# Patient Record
Sex: Female | Born: 1960 | Race: White | Hispanic: No | Marital: Married | Smoking: Never smoker
Health system: Southern US, Community
[De-identification: ages and names within clinical notes are randomized; demographics above are authoritative.]

## PROBLEM LIST (undated history)

## (undated) HISTORY — PX: MOUTH SURGERY: SHX715

---

## 1998-01-01 ENCOUNTER — Other Ambulatory Visit: Admission: RE | Admit: 1998-01-01 | Discharge: 1998-01-01 | Payer: Self-pay | Admitting: Obstetrics and Gynecology

## 2012-12-08 ENCOUNTER — Other Ambulatory Visit (HOSPITAL_COMMUNITY)
Admission: RE | Admit: 2012-12-08 | Discharge: 2012-12-08 | Disposition: A | Discharge status: Home / Self Care | Payer: BC Managed Care – PPO | Source: Ambulatory Visit | Attending: Obstetrics and Gynecology | Admitting: Obstetrics and Gynecology

## 2012-12-08 ENCOUNTER — Other Ambulatory Visit: Payer: Self-pay | Admitting: Obstetrics and Gynecology

## 2012-12-08 DIAGNOSIS — Z1151 Encounter for screening for human papillomavirus (HPV): Secondary | ICD-10-CM | POA: Insufficient documentation

## 2012-12-08 DIAGNOSIS — Z01419 Encounter for gynecological examination (general) (routine) without abnormal findings: Secondary | ICD-10-CM | POA: Insufficient documentation

## 2019-09-24 ENCOUNTER — Ambulatory Visit: Payer: Self-pay | Attending: Internal Medicine

## 2019-09-24 DIAGNOSIS — Z23 Encounter for immunization: Secondary | ICD-10-CM | POA: Insufficient documentation

## 2019-09-24 NOTE — Progress Notes (Signed)
   Covid-19 Vaccination Clinic  Name:  Carolyn Dominguez    MRN: [id number] DOB: 09/24/1960  09/24/2019  Carolyn Dominguez was observed post Covid-19 immunization for 15 minutes without incidence. She was provided with Vaccine Information Sheet and instruction to access the V-Safe system.   Carolyn Dominguez was instructed to call 911 with any severe reactions post vaccine: Marland Kitchen Difficulty breathing  . Swelling of your face and throat  . A fast heartbeat  . A bad rash all over your body  . Dizziness and weakness    Immunizations Administered    Name Date Dose VIS Date Route   Pfizer COVID-19 Vaccine 09/24/2019 12:47 PM 0.3 mL 07/08/2019 Intramuscular   Manufacturer: [employer], [employer]   Lot: [id number]   NDC: [address]-3

## 2019-10-15 ENCOUNTER — Ambulatory Visit: Payer: Self-pay | Attending: Internal Medicine

## 2019-10-15 DIAGNOSIS — Z23 Encounter for immunization: Secondary | ICD-10-CM

## 2019-10-15 NOTE — Progress Notes (Signed)
   Covid-19 Vaccination Clinic  Name:  Carolyn Dominguez    MRN: [id number] DOB: 1961-05-03  10/15/2019  Carolyn Dominguez was observed post Covid-19 immunization for 15 minutes without incident. She was provided with Vaccine Information Sheet and instruction to access the V-Safe system.   Carolyn Dominguez was instructed to call 911 with any severe reactions post vaccine: Marland Kitchen Difficulty breathing  . Swelling of face and throat  . A fast heartbeat  . A bad rash all over body  . Dizziness and weakness   Immunizations Administered    Name Date Dose VIS Date Route   Pfizer COVID-19 Vaccine 10/15/2019 12:41 PM 0.3 mL 07/08/2019 Intramuscular   Manufacturer: [employer], [employer]   Lot: [id number]   NDC: [address]-6

## 2019-10-19 ENCOUNTER — Ambulatory Visit: Payer: Self-pay

## 2020-03-16 ENCOUNTER — Other Ambulatory Visit: Payer: Self-pay

## 2020-03-16 ENCOUNTER — Ambulatory Visit: Payer: Self-pay

## 2020-03-16 ENCOUNTER — Other Ambulatory Visit: Payer: Self-pay | Admitting: Occupational Medicine

## 2020-03-16 DIAGNOSIS — M25532 Pain in left wrist: Secondary | ICD-10-CM

## 2020-03-20 ENCOUNTER — Other Ambulatory Visit: Payer: Self-pay

## 2020-03-20 ENCOUNTER — Other Ambulatory Visit: Payer: Self-pay | Admitting: Occupational Medicine

## 2020-03-20 ENCOUNTER — Ambulatory Visit: Payer: Self-pay

## 2020-03-20 DIAGNOSIS — M25522 Pain in left elbow: Secondary | ICD-10-CM

## 2021-04-08 ENCOUNTER — Other Ambulatory Visit (HOSPITAL_COMMUNITY): Payer: Self-pay | Admitting: Orthopedic Surgery

## 2021-04-25 ENCOUNTER — Other Ambulatory Visit: Payer: Self-pay

## 2021-04-25 ENCOUNTER — Encounter (HOSPITAL_BASED_OUTPATIENT_CLINIC_OR_DEPARTMENT_OTHER): Payer: Self-pay | Admitting: Orthopedic Surgery

## 2021-04-26 ENCOUNTER — Other Ambulatory Visit (HOSPITAL_BASED_OUTPATIENT_CLINIC_OR_DEPARTMENT_OTHER): Payer: Self-pay

## 2021-05-02 ENCOUNTER — Encounter (HOSPITAL_BASED_OUTPATIENT_CLINIC_OR_DEPARTMENT_OTHER)
Admission: RE | Disposition: A | Discharge status: Home / Self Care | Payer: Self-pay | Source: Home / Self Care | Attending: Orthopedic Surgery

## 2021-05-02 ENCOUNTER — Ambulatory Visit (HOSPITAL_BASED_OUTPATIENT_CLINIC_OR_DEPARTMENT_OTHER): Payer: BC Managed Care – PPO | Admitting: Anesthesiology

## 2021-05-02 ENCOUNTER — Ambulatory Visit (HOSPITAL_BASED_OUTPATIENT_CLINIC_OR_DEPARTMENT_OTHER)
Admission: RE | Admit: 2021-05-02 | Discharge: 2021-05-02 | Disposition: A | Discharge status: Home / Self Care | Payer: BC Managed Care – PPO | Attending: Orthopedic Surgery | Admitting: Orthopedic Surgery

## 2021-05-02 ENCOUNTER — Other Ambulatory Visit: Payer: Self-pay

## 2021-05-02 DIAGNOSIS — Z882 Allergy status to sulfonamides status: Secondary | ICD-10-CM | POA: Insufficient documentation

## 2021-05-02 DIAGNOSIS — M2022 Hallux rigidus, left foot: Secondary | ICD-10-CM | POA: Insufficient documentation

## 2021-05-02 SURGERY — CHEILECTOMY, GREAT TOE, WITH IMPLANT INSERTION
Anesthesia: General | Laterality: Left

## 2021-05-02 MED ORDER — FENTANYL CITRATE (PF) 100 MCG/2ML IJ SOLN: 25.0000 ug | INTRAMUSCULAR | Status: DC | PRN

## 2021-05-02 MED ORDER — CEFAZOLIN SODIUM-DEXTROSE 2-4 GM/100ML-% IV SOLN
2.0000 g | INTRAVENOUS | Status: AC
Start: 2021-05-02 — End: 2021-05-02
  Administered 2021-05-02: 2 g via INTRAVENOUS

## 2021-05-02 MED ORDER — FENTANYL CITRATE (PF) 100 MCG/2ML IJ SOLN
100.0000 ug | Freq: Once | INTRAMUSCULAR | Status: AC
  Administered 2021-05-02: 50 ug via INTRAVENOUS

## 2021-05-02 MED ORDER — DEXAMETHASONE SODIUM PHOSPHATE 10 MG/ML IJ SOLN
INTRAMUSCULAR | Status: DC | PRN
  Administered 2021-05-02: 4 mg via INTRAVENOUS

## 2021-05-02 MED ORDER — ACETAMINOPHEN 500 MG PO TABS
ORAL_TABLET | ORAL | Status: AC
  Filled 2021-05-02: qty 2

## 2021-05-02 MED ORDER — LACTATED RINGERS IV SOLN: INTRAVENOUS | Status: DC

## 2021-05-02 MED ORDER — PROPOFOL 500 MG/50ML IV EMUL
INTRAVENOUS | Status: DC | PRN
  Administered 2021-05-02: 25 ug/kg/min via INTRAVENOUS

## 2021-05-02 MED ORDER — CEFAZOLIN SODIUM-DEXTROSE 2-4 GM/100ML-% IV SOLN
INTRAVENOUS | Status: AC
  Filled 2021-05-02: qty 100

## 2021-05-02 MED ORDER — BUPIVACAINE-EPINEPHRINE (PF) 0.5% -1:200000 IJ SOLN
INTRAMUSCULAR | Status: DC | PRN
  Administered 2021-05-02: 30 mL via PERINEURAL
  Administered 2021-05-02: 10 mL via PERINEURAL

## 2021-05-02 MED ORDER — LIDOCAINE 2% (20 MG/ML) 5 ML SYRINGE
INTRAMUSCULAR | Status: DC | PRN
  Administered 2021-05-02: 60 mg via INTRAVENOUS

## 2021-05-02 MED ORDER — MIDAZOLAM HCL 2 MG/2ML IJ SOLN
INTRAMUSCULAR | Status: AC
  Filled 2021-05-02: qty 2

## 2021-05-02 MED ORDER — ONDANSETRON HCL 4 MG/2ML IJ SOLN
INTRAMUSCULAR | Status: DC | PRN
  Administered 2021-05-02: 4 mg via INTRAVENOUS

## 2021-05-02 MED ORDER — SODIUM CHLORIDE 0.9 % IV SOLN
INTRAVENOUS | Status: DC
Start: 2021-05-02 — End: 2021-05-02

## 2021-05-02 MED ORDER — 0.9 % SODIUM CHLORIDE (POUR BTL) OPTIME
TOPICAL | Status: DC | PRN
  Administered 2021-05-02: 500 mL

## 2021-05-02 MED ORDER — PROPOFOL 10 MG/ML IV BOLUS
INTRAVENOUS | Status: DC | PRN
  Administered 2021-05-02: 150 mg via INTRAVENOUS

## 2021-05-02 MED ORDER — ONDANSETRON HCL 4 MG/2ML IJ SOLN: 4.0000 mg | Freq: Once | INTRAMUSCULAR | Status: DC | PRN

## 2021-05-02 MED ORDER — OXYCODONE HCL 5 MG PO TABS: 5.0000 mg | ORAL_TABLET | Freq: Four times a day (QID) | ORAL | 0 refills | Status: AC | PRN

## 2021-05-02 MED ORDER — MIDAZOLAM HCL 2 MG/2ML IJ SOLN
2.0000 mg | Freq: Once | INTRAMUSCULAR | Status: AC
  Administered 2021-05-02: 2 mg via INTRAVENOUS

## 2021-05-02 MED ORDER — VANCOMYCIN HCL 500 MG IV SOLR
INTRAVENOUS | Status: DC | PRN
  Administered 2021-05-02: 500 mg

## 2021-05-02 MED ORDER — CLONIDINE HCL (ANALGESIA) 100 MCG/ML EP SOLN
EPIDURAL | Status: DC | PRN
  Administered 2021-05-02: 50 ug

## 2021-05-02 MED ORDER — ACETAMINOPHEN 500 MG PO TABS
1000.0000 mg | ORAL_TABLET | Freq: Once | ORAL | Status: AC
  Administered 2021-05-02: 1000 mg via ORAL

## 2021-05-02 MED ORDER — FENTANYL CITRATE (PF) 100 MCG/2ML IJ SOLN
INTRAMUSCULAR | Status: AC
  Filled 2021-05-02: qty 2

## 2021-05-02 SURGICAL SUPPLY — 59 items
APL PRP STRL LF DISP 70% ISPRP (MISCELLANEOUS) ×1
BANDAGE ESMARK 6X9 LF (GAUZE/BANDAGES/DRESSINGS) IMPLANT
BLADE SURG 15 STRL LF DISP TIS (BLADE) ×2 IMPLANT
BLADE SURG 15 STRL SS (BLADE) ×6
BNDG CMPR 9X4 STRL LF SNTH (GAUZE/BANDAGES/DRESSINGS)
BNDG CMPR 9X6 STRL LF SNTH (GAUZE/BANDAGES/DRESSINGS)
BNDG COHESIVE 4X5 TAN ST LF (GAUZE/BANDAGES/DRESSINGS) ×3 IMPLANT
BNDG CONFORM 2 STRL LF (GAUZE/BANDAGES/DRESSINGS) IMPLANT
BNDG CONFORM 3 STRL LF (GAUZE/BANDAGES/DRESSINGS) ×3 IMPLANT
BNDG ELASTIC 4X5.8 VLCR STR LF (GAUZE/BANDAGES/DRESSINGS) ×3 IMPLANT
BNDG ESMARK 4X9 LF (GAUZE/BANDAGES/DRESSINGS) IMPLANT
BNDG ESMARK 6X9 LF (GAUZE/BANDAGES/DRESSINGS)
CHLORAPREP W/TINT 26 (MISCELLANEOUS) ×3 IMPLANT
COVER BACK TABLE 60X90IN (DRAPES) ×3 IMPLANT
CUFF TOURN SGL QUICK 34 (TOURNIQUET CUFF)
CUFF TRNQT CYL 34X4.125X (TOURNIQUET CUFF) IMPLANT
DRAPE EXTREMITY T 121X128X90 (DISPOSABLE) ×3 IMPLANT
DRAPE OEC MINIVIEW 54X84 (DRAPES) IMPLANT
DRAPE SURG 17X23 STRL (DRAPES) IMPLANT
DRAPE U-SHAPE 47X51 STRL (DRAPES) ×3 IMPLANT
DRSG MEPITEL 4X7.2 (GAUZE/BANDAGES/DRESSINGS) ×3 IMPLANT
DRSG PAD ABDOMINAL 8X10 ST (GAUZE/BANDAGES/DRESSINGS) ×3 IMPLANT
ELECT REM PT RETURN 9FT ADLT (ELECTROSURGICAL) ×3
ELECTRODE REM PT RTRN 9FT ADLT (ELECTROSURGICAL) ×1 IMPLANT
GAUZE SPONGE 4X4 12PLY STRL (GAUZE/BANDAGES/DRESSINGS) ×3 IMPLANT
GLOVE SRG 8 PF TXTR STRL LF DI (GLOVE) ×2 IMPLANT
GLOVE SURG ENC MOIS LTX SZ8 (GLOVE) ×3 IMPLANT
GLOVE SURG LTX SZ8 (GLOVE) ×3 IMPLANT
GLOVE SURG UNDER POLY LF SZ8 (GLOVE) ×6
GOWN STRL REUS W/ TWL LRG LVL3 (GOWN DISPOSABLE) ×1 IMPLANT
GOWN STRL REUS W/ TWL XL LVL3 (GOWN DISPOSABLE) ×2 IMPLANT
GOWN STRL REUS W/TWL LRG LVL3 (GOWN DISPOSABLE) ×3
GOWN STRL REUS W/TWL XL LVL3 (GOWN DISPOSABLE) ×6
IMPL MTP CARTIVA 8MM (Orthopedic Implant) ×1 IMPLANT
IMPLANT MTP CARTIVA 8MM (Orthopedic Implant) ×3 IMPLANT
NEEDLE HYPO 25X1 1.5 SAFETY (NEEDLE) IMPLANT
NS IRRIG 1000ML POUR BTL (IV SOLUTION) ×3 IMPLANT
PACK BASIN DAY SURGERY FS (CUSTOM PROCEDURE TRAY) ×3 IMPLANT
PAD CAST 4YDX4 CTTN HI CHSV (CAST SUPPLIES) ×1 IMPLANT
PADDING CAST COTTON 4X4 STRL (CAST SUPPLIES) ×3
PENCIL SMOKE EVACUATOR (MISCELLANEOUS) ×3 IMPLANT
SANITIZER HAND PURELL 535ML FO (MISCELLANEOUS) ×3 IMPLANT
SHEET MEDIUM DRAPE 40X70 STRL (DRAPES) ×3 IMPLANT
SLEEVE SCD COMPRESS KNEE MED (STOCKING) ×3 IMPLANT
SPONGE T-LAP 18X18 ~~LOC~~+RFID (SPONGE) ×3 IMPLANT
STOCKINETTE 6  STRL (DRAPES) ×3
STOCKINETTE 6 STRL (DRAPES) ×1 IMPLANT
SUCTION FRAZIER HANDLE 10FR (MISCELLANEOUS) ×3
SUCTION TUBE FRAZIER 10FR DISP (MISCELLANEOUS) ×1 IMPLANT
SUT ETHILON 3 0 PS 1 (SUTURE) ×3 IMPLANT
SUT MNCRL AB 3-0 PS2 18 (SUTURE) ×3 IMPLANT
SUT VIC AB 2-0 SH 27 (SUTURE) ×3
SUT VIC AB 2-0 SH 27XBRD (SUTURE) ×1 IMPLANT
SYR BULB EAR ULCER 3OZ GRN STR (SYRINGE) ×3 IMPLANT
SYR CONTROL 10ML LL (SYRINGE) IMPLANT
TOWEL GREEN STERILE FF (TOWEL DISPOSABLE) ×3 IMPLANT
TUBE CONNECTING 20'X1/4 (TUBING) ×1
TUBE CONNECTING 20X1/4 (TUBING) ×2 IMPLANT
UNDERPAD 30X36 HEAVY ABSORB (UNDERPADS AND DIAPERS) ×3 IMPLANT

## 2021-05-02 NOTE — Anesthesia Procedure Notes (Signed)
Anesthesia Regional Block: Adductor canal block   Pre-Anesthetic Checklist: , timeout performed,  Correct Patient, Correct Site, Correct Laterality,  Correct Procedure, Correct Position, site marked,  Risks and benefits discussed,  Surgical consent,  Pre-op evaluation,  At surgeon's request and post-op pain management  Laterality: Left  Prep: chloraprep       Needles:  Injection technique: Single-shot  Needle Type: Echogenic Needle     Needle Length: 9cm  Needle Gauge: 21     Additional Needles:   Procedures:,,,, ultrasound used (permanent image in chart),,    Narrative:  Start time: 05/02/2021 8:08 AM End time: 05/02/2021 8:12 AM Injection made incrementally with aspirations every 5 mL.  Performed by: Personally  Anesthesiologist: Cecile Hearing, MD  Additional Notes: No pain on injection. No increased resistance to injection. Injection made in 5cc increments.  Good needle visualization.  Patient tolerated procedure well.

## 2021-05-02 NOTE — Anesthesia Procedure Notes (Signed)
Procedure Name: LMA Insertion Date/Time: 05/02/2021 9:06 AM Performed by: Sheryn Bison, CRNA Pre-anesthesia Checklist: Patient identified, Emergency Drugs available, Suction available and Patient being monitored Patient Re-evaluated:Patient Re-evaluated prior to induction Oxygen Delivery Method: Circle System Utilized Preoxygenation: Pre-oxygenation with 100% oxygen Induction Type: IV induction Ventilation: Mask ventilation without difficulty LMA: LMA inserted LMA Size: 4.0 Number of attempts: 1 Airway Equipment and Method: bite block Placement Confirmation: positive ETCO2 Tube secured with: Tape Dental Injury: Teeth and Oropharynx as per pre-operative assessment

## 2021-05-02 NOTE — Transfer of Care (Signed)
Immediate Anesthesia Transfer of Care Note  Patient: Carolyn Dominguez  Procedure(s) Performed: Left hallux metatarsophalangeal joint cheilectomy and cheilectomy with joint resurfacing (Left)  Patient Location: PACU  Anesthesia Type:GA combined with regional for post-op pain  Level of Consciousness: sedated  Airway & Oxygen Therapy: Patient Spontanous Breathing and Patient connected to face mask oxygen  Post-op Assessment: Report given to RN and Post -op Vital signs reviewed and stable  Post vital signs: Reviewed and stable  Last Vitals:  Vitals Value Taken Time  BP 90/50 05/02/21 0943  Temp    Pulse 65 05/02/21 0944  Resp 13 05/02/21 0944  SpO2 100 % 05/02/21 0944  Vitals shown include unvalidated device data.  Last Pain:  Vitals:   05/02/21 0718  TempSrc: Oral  PainSc: 0-No pain      Patients Stated Pain Goal: 3 (05/02/21 4081)  Complications: No notable events documented.

## 2021-05-02 NOTE — H&P (Signed)
Carolyn Dominguez is an 60 y.o. female.   Chief Complaint: Left foot pain HPI: 60 year old female without significant past medical history has a long history of left forefoot pain due to hallux rigidus.  She has failed nonoperative treatment to date and presents for surgical correction of this painful left forefoot condition.  History reviewed. No pertinent past medical history.  Past Surgical History:  Procedure Laterality Date   MOUTH SURGERY      History reviewed. No pertinent family history. Social History:  reports that she has never smoked. She has never used smokeless tobacco. She reports that she does not drink alcohol. No history on file for drug use.  Allergies:  Allergies  Allergen Reactions   Sulfa Antibiotics Itching    No medications prior to admission.    No results found for this or any previous visit (from the past 48 hour(s)). No results found.  Review of Systems no recent fever, chills, nausea, vomiting or changes in her appetite  Blood pressure 119/78, pulse 66, temperature 98 F (36.7 C), temperature source Oral, resp. rate 17, height 5\' 6"  (1.676 m), weight 75.8 kg, SpO2 98 %. Physical Exam  Well-nourished well-developed woman in no apparent distress.  Alert and oriented x4.  Normal mood and affect.  Gait is normal.  The left hallux has decreased range of motion.  Tender bony prominences dorsally over the first metatarsal head.  Skin is healthy and intact.  Pulses are palpable.  No lymphadenopathy.  Assessment/Plan Left hallux rigidus -to the operating room today for hallux MP joint cheilectomy and possible resurfacing.  The risks and benefits of the alternative treatment options have been discussed in detail.  The patient wishes to proceed with surgery and specifically understands risks of bleeding, infection, nerve damage, blood clots, need for additional surgery, amputation and death.   , MD 05-11-21, 8:48 AM

## 2021-05-02 NOTE — Discharge Instructions (Addendum)
Toni Arthurs, MD [facility]  Please read the following information regarding your care after surgery.  Medications  You only need a prescription for the narcotic pain medicine (ex. oxycodone, Percocet, Norco).  All of the other medicines listed below are available over the counter. X Aleve 2 pills twice a day for the first 3 days after surgery. X acetominophen (Tylenol) 650 mg every 4-6 hours as you need for minor to moderate pain X oxycodone as prescribed for severe pain  Narcotic pain medicine (ex. oxycodone, Percocet, Vicodin) will cause constipation.  To prevent this problem, take the following medicines while you are taking any pain medicine. X docusate sodium (Colace) 100 mg twice a day X senna (Senokot) 2 tablets twice a day  ? To help prevent blood clots, take a baby aspirin (81 mg) twice a day for two weeks after surgery.  You should also get up every hour while you are awake to move around.    Weight Bearing ? Bear weight when you are able on your operated leg or foot. X Bear weight only on your operated foot in the post-op shoe. ? Do not bear any weight on the operated leg or foot.  Cast / Splint / Dressing X Keep your splint, cast or dressing clean and dry.  Don't put anything (coat hanger, pencil, etc) down inside of it.  If it gets damp, use a hair dryer on the cool setting to dry it.  If it gets soaked, call the office to schedule an appointment for a cast change. ? Remove your dressing 3 days after surgery and cover the incisions with dry dressings.    After your dressing, cast or splint is removed; you may shower, but do not soak or scrub the wound.  Allow the water to run over it, and then gently pat it dry.  Swelling It is normal for you to have swelling where you had surgery.  To reduce swelling and pain, keep your toes above your nose for at least 3 days after surgery.  It may be necessary to keep your foot or leg elevated for several weeks.  If it hurts, it should be  elevated.  Follow Up Call my office at [phone] when you are discharged from the hospital or surgery center to schedule an appointment to be seen two weeks after surgery.  Call my office at [phone] if you develop a fever >101.5 F, nausea, vomiting, bleeding from the surgical site or severe pain.    No Tylenol until 1:26 pm   Post Anesthesia Home Care Instructions  Activity: Get plenty of rest for the remainder of the day. A responsible individual must stay with you for 24 hours following the procedure.  For the next 24 hours, DO NOT: -Drive a car -Advertising copywriter -Drink alcoholic beverages -Take any medication unless instructed by your physician -Make any legal decisions or sign important papers.  Meals: Start with liquid foods such as gelatin or soup. Progress to regular foods as tolerated. Avoid greasy, spicy, heavy foods. If nausea and/or vomiting occur, drink only clear liquids until the nausea and/or vomiting subsides. Call your physician if vomiting continues.  Special Instructions/Symptoms: Your throat may feel dry or sore from the anesthesia or the breathing tube placed in your throat during surgery. If this causes discomfort, gargle with warm salt water. The discomfort should disappear within 24 hours.  If you had a scopolamine patch placed behind your ear for the management of post- operative nausea and/or vomiting:  1.  The medication in the patch is effective for 72 hours, after which it should be removed.  Wrap patch in a tissue and discard in the trash. Wash hands thoroughly with soap and water. 2. You may remove the patch earlier than 72 hours if you experience unpleasant side effects which may include dry mouth, dizziness or visual disturbances. 3. Avoid touching the patch. Wash your hands with soap and water after contact with the patch.    Regional Anesthesia Blocks  1. Numbness or the inability to move the "blocked" extremity may last from 3-48 hours  after placement. The length of time depends on the medication injected and your individual response to the medication. If the numbness is not going away after 48 hours, call your surgeon.  2. The extremity that is blocked will need to be protected until the numbness is gone and the  Strength has returned. Because you cannot feel it, you will need to take extra care to avoid injury. Because it may be weak, you may have difficulty moving it or using it. You may not know what position it is in without looking at it while the block is in effect.  3. For blocks in the legs and feet, returning to weight bearing and walking needs to be done carefully. You will need to wait until the numbness is entirely gone and the strength has returned. You should be able to move your leg and foot normally before you try and bear weight or walk. You will need someone to be with you when you first try to ensure you do not fall and possibly risk injury.  4. Bruising and tenderness at the needle site are common side effects and will resolve in a few days.  5. Persistent numbness or new problems with movement should be communicated to the surgeon or the [facility] [phone] [facility] [phone]).

## 2021-05-02 NOTE — Anesthesia Procedure Notes (Signed)
Anesthesia Regional Block: Popliteal block   Pre-Anesthetic Checklist: , timeout performed,  Correct Patient, Correct Site, Correct Laterality,  Correct Procedure, Correct Position, site marked,  Risks and benefits discussed,  Surgical consent,  Pre-op evaluation,  At surgeon's request and post-op pain management  Laterality: Left  Prep: chloraprep       Needles:  Injection technique: Single-shot  Needle Type: Echogenic Needle     Needle Length: 9cm  Needle Gauge: 21     Additional Needles:   Procedures:,,,, ultrasound used (permanent image in chart),,    Narrative:  Start time: 05/02/2021 8:01 AM End time: 05/02/2021 8:08 AM Injection made incrementally with aspirations every 5 mL.  Performed by: Personally  Anesthesiologist: Cecile Hearing, MD  Additional Notes: No pain on injection. No increased resistance to injection. Injection made in 5cc increments.  Good needle visualization.  Patient tolerated procedure well.

## 2021-05-02 NOTE — Anesthesia Postprocedure Evaluation (Signed)
Anesthesia Post Note  Patient: Carolyn Dominguez  Procedure(s) Performed: Left hallux metatarsophalangeal joint cheilectomy and cheilectomy with joint resurfacing (Left)     Patient location during evaluation: PACU Anesthesia Type: General Level of consciousness: awake and alert Pain management: pain level controlled Vital Signs Assessment: post-procedure vital signs reviewed and stable Respiratory status: spontaneous breathing, nonlabored ventilation, respiratory function stable and patient connected to nasal cannula oxygen Cardiovascular status: blood pressure returned to baseline and stable Postop Assessment: no apparent nausea or vomiting Anesthetic complications: no   No notable events documented.  Last Vitals:  Vitals:   05/02/21 1030 05/02/21 1040  BP: 115/68 128/71  Pulse: 60 64  Resp: 13 18  Temp:  36.4 C  SpO2: 100% 99%    Last Pain:  Vitals:   05/02/21 1040  TempSrc:   PainSc: 0-No pain                 Cecile Hearing

## 2021-05-02 NOTE — Anesthesia Preprocedure Evaluation (Signed)
Anesthesia Evaluation  Patient identified by MRN, date of birth, ID band Patient awake    Reviewed: Allergy & Precautions, NPO status , Patient's Chart, lab work & pertinent test results  Airway Mallampati: II  TM Distance: >3 FB Neck ROM: Full    Dental  (+) Teeth Intact, Dental Advisory Given   Pulmonary neg pulmonary ROS,    Pulmonary exam normal breath sounds clear to auscultation       Cardiovascular negative cardio ROS Normal cardiovascular exam Rhythm:Regular Rate:Normal     Neuro/Psych negative neurological ROS  negative psych ROS   GI/Hepatic negative GI ROS, Neg liver ROS,   Endo/Other  negative endocrine ROS  Renal/GU negative Renal ROS     Musculoskeletal Left hallux rigidus   Abdominal   Peds  Hematology negative hematology ROS (+)   Anesthesia Other Findings Day of surgery medications reviewed with the patient.  Reproductive/Obstetrics                             Anesthesia Physical Anesthesia Plan  ASA: 2  Anesthesia Plan: General   Post-op Pain Management:  Regional for Post-op pain   Induction: Intravenous  PONV Risk Score and Plan: 3 and Midazolam, Dexamethasone and Ondansetron  Airway Management Planned: LMA  Additional Equipment:   Intra-op Plan:   Post-operative Plan: Extubation in OR  Informed Consent: I have reviewed the patients History and Physical, chart, labs and discussed the procedure including the risks, benefits and alternatives for the proposed anesthesia with the patient or authorized representative who has indicated his/her understanding and acceptance.     Dental advisory given  Plan Discussed with: CRNA, Anesthesiologist and Surgeon  Anesthesia Plan Comments:         Anesthesia Quick Evaluation

## 2021-05-02 NOTE — Progress Notes (Signed)
Assisted Dr. Turk with left, ultrasound guided, popliteal, adductor canal block. Side rails up, monitors on throughout procedure. See vital signs in flow sheet. Tolerated Procedure well. °

## 2021-05-02 NOTE — Op Note (Signed)
05/02/2021  9:45 AM  PATIENT:  Carolyn Dominguez  60 y.o. female  PRE-OPERATIVE DIAGNOSIS:  Left hallux rigidus  POST-OPERATIVE DIAGNOSIS:  Left hallux rigidus  Procedure(s):  Left hallux metatarsophalangeal joint cheilectomy and joint resurfacing (8 mm Cartiva implant)  SURGEON:  Toni Arthurs, MD  ASSISTANT: none  ANESTHESIA:   General, regional  EBL:  minimal   TOURNIQUET:  19 min @ 250 mm hg thigh  COMPLICATIONS:  None apparent  DISPOSITION:  Extubated, awake and stable to recovery.  INDICATION FOR PROCEDURE: The patient is a 60 year old female without significant past medical history she has a long history of left forefoot pain due to hallux rigidus.  She has failed nonoperative treatment and presents today for surgical correction of this painful condition.  After pre operative consent was obtained, and the correct operative site was identified, the patient was brought to the operating room and placed supine on the OR table.  Anesthesia was administered.  Pre-operative antibiotics were administered.  A surgical timeout was taken.   PROCEDURE IN DETAIL:  After pre operative consent was obtained, and the correct operative site was identified, the patient was brought to the operating room and placed supine on the OR table.  Anesthesia was administered.  Pre-operative antibiotics were administered.  A surgical timeout was taken.  The left lower extremity was prepped and draped in standard sterile fashion with a tourniquet around the thigh.  The extremity was elevated and the tourniquet was inflated to 250 mmHg.  A longitudinal incision was made over the hallux MP joint.  Dissection was carried sharply down through the subcutaneous tissues.  Extensor houses longus and brevis were protected.  The dorsal joint capsule was incised and elevated medially and laterally.  The collateral ligaments were released.  The patient was noted to have full-thickness cartilage loss at the dorsal one third of the  metatarsal head.  Dorsal osteophytes were resected with a rondure.  The wound was irrigated copiously.  A guide pin was then inserted just dorsal to the midline in the metatarsal head.  An 8 mm reamer was then advanced over the guidepin and reamed just shy of the stop at the reamer.  The guidepin was removed.  The wound was irrigated copiously and all debris removed from the socket.  The socket was dried appropriately.  An 8 mm Cartiva implant was inserted and was noted to seat appropriately.  The joint was reduced.  Range of motion was significantly improved.  Wound was irrigated copiously.  The dorsal joint capsule was repaired with 2-0 Vicryl figure-of-eight sutures.  The wound was sprinkled with vancomycin powder.  Subcutaneous tissues were approximated with inverted simple sutures of 3-0 Monocryl.  The incision was closed with running 3-0 nylon.  Sterile dressings were applied followed by compression wrap.  The tourniquet was released after application of the dressings.  The patient was awakened from anesthesia and transported to the recovery room in stable condition.  FOLLOW UP PLAN: Weightbearing as tolerated in a flat postop shoe.  Follow-up in the office in 2 weeks for suture removal and and to initiate active range of motion.

## 2022-03-04 IMAGING — DX DG WRIST COMPLETE 3+V*L*
4 series · 4 of 4 positions shown · non-contrast
Comparison: None.

CLINICAL DATA: Left wrist pain after a fall today.

EXAM:
LEFT WRIST - COMPLETE 3+ VIEW

[wrist pa]
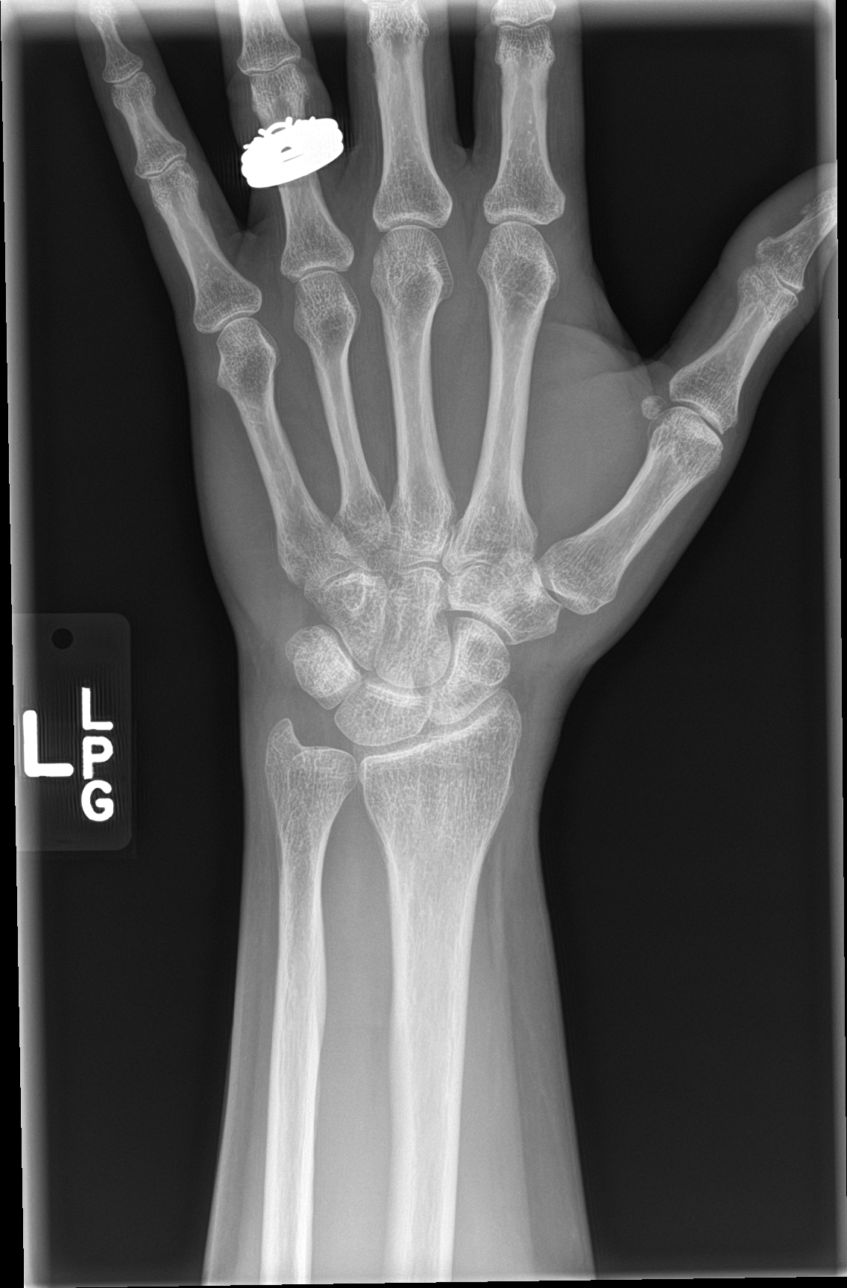

[wrist obl]
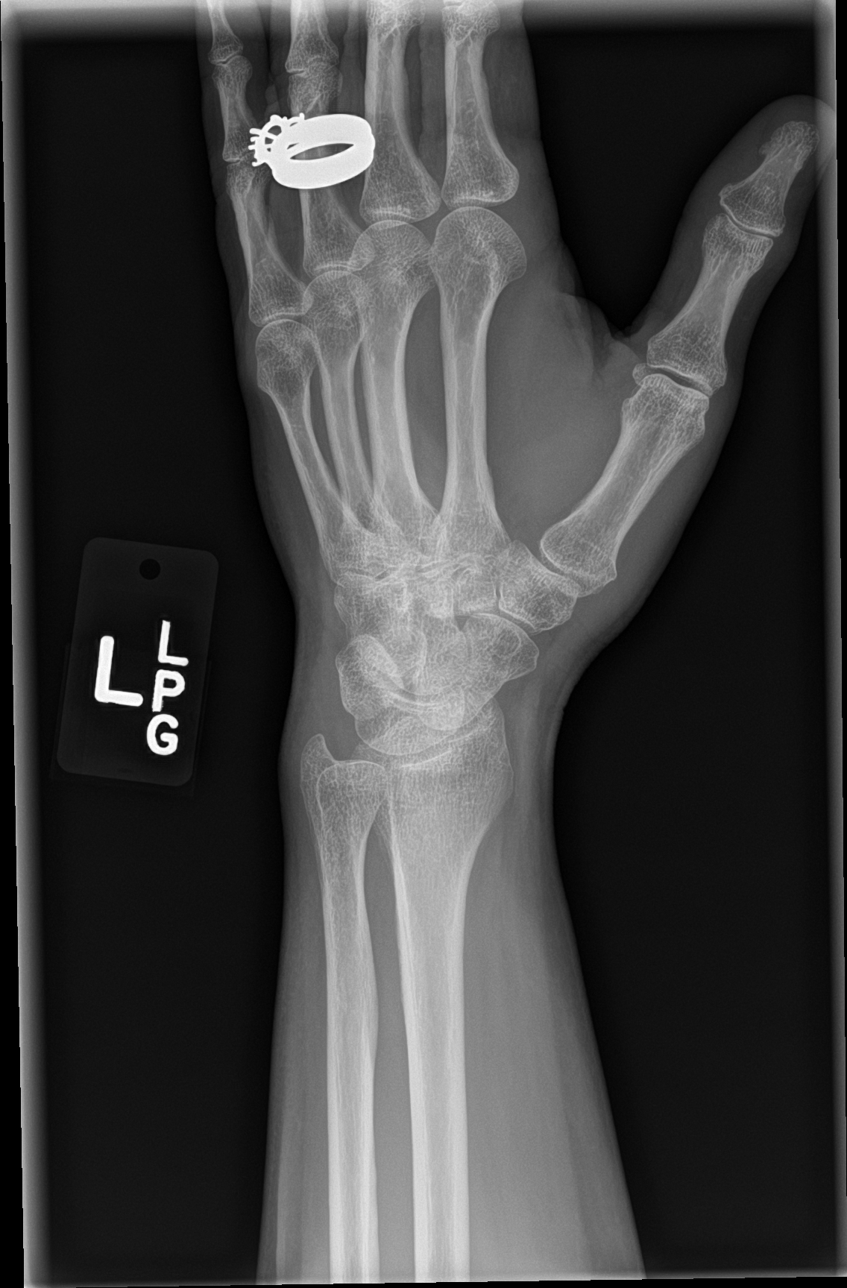

[wrist lat]
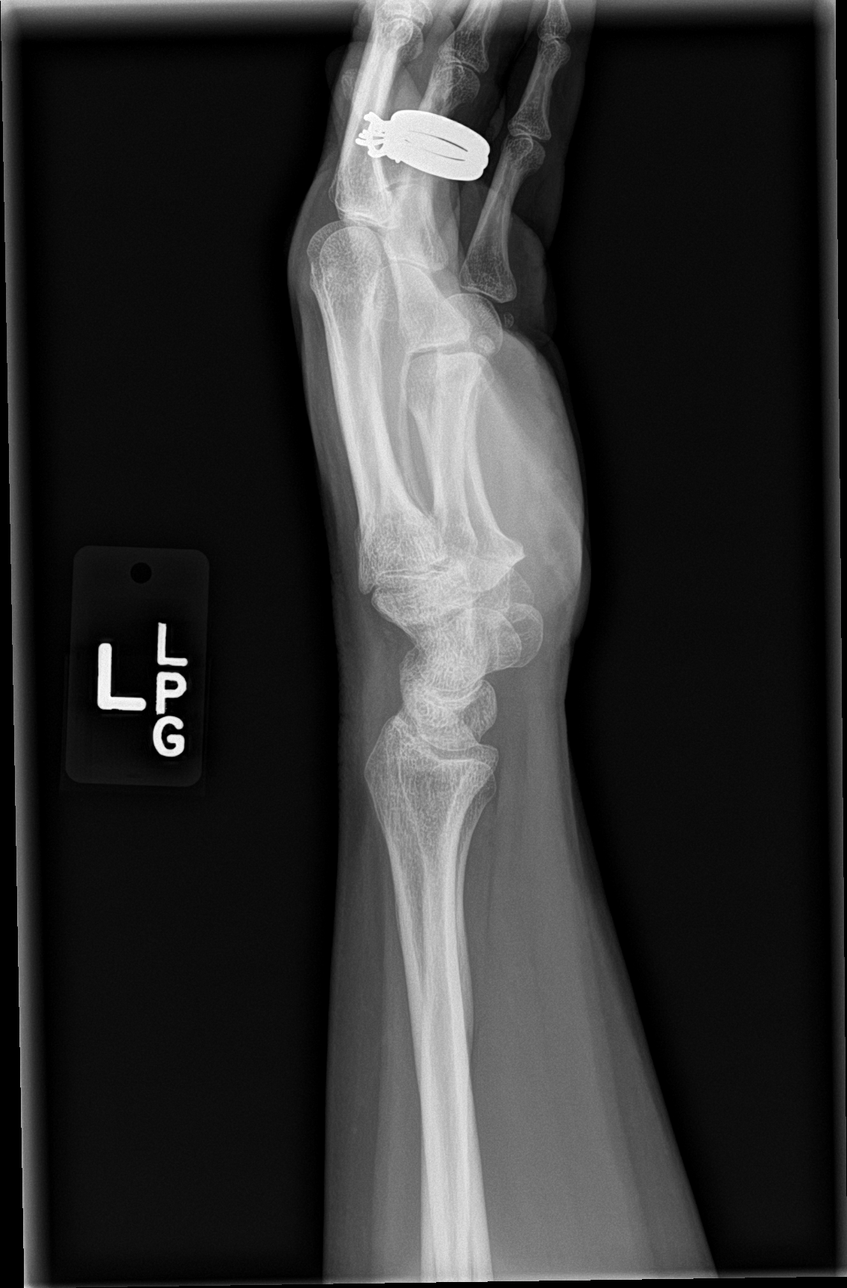

[wrist navicular]
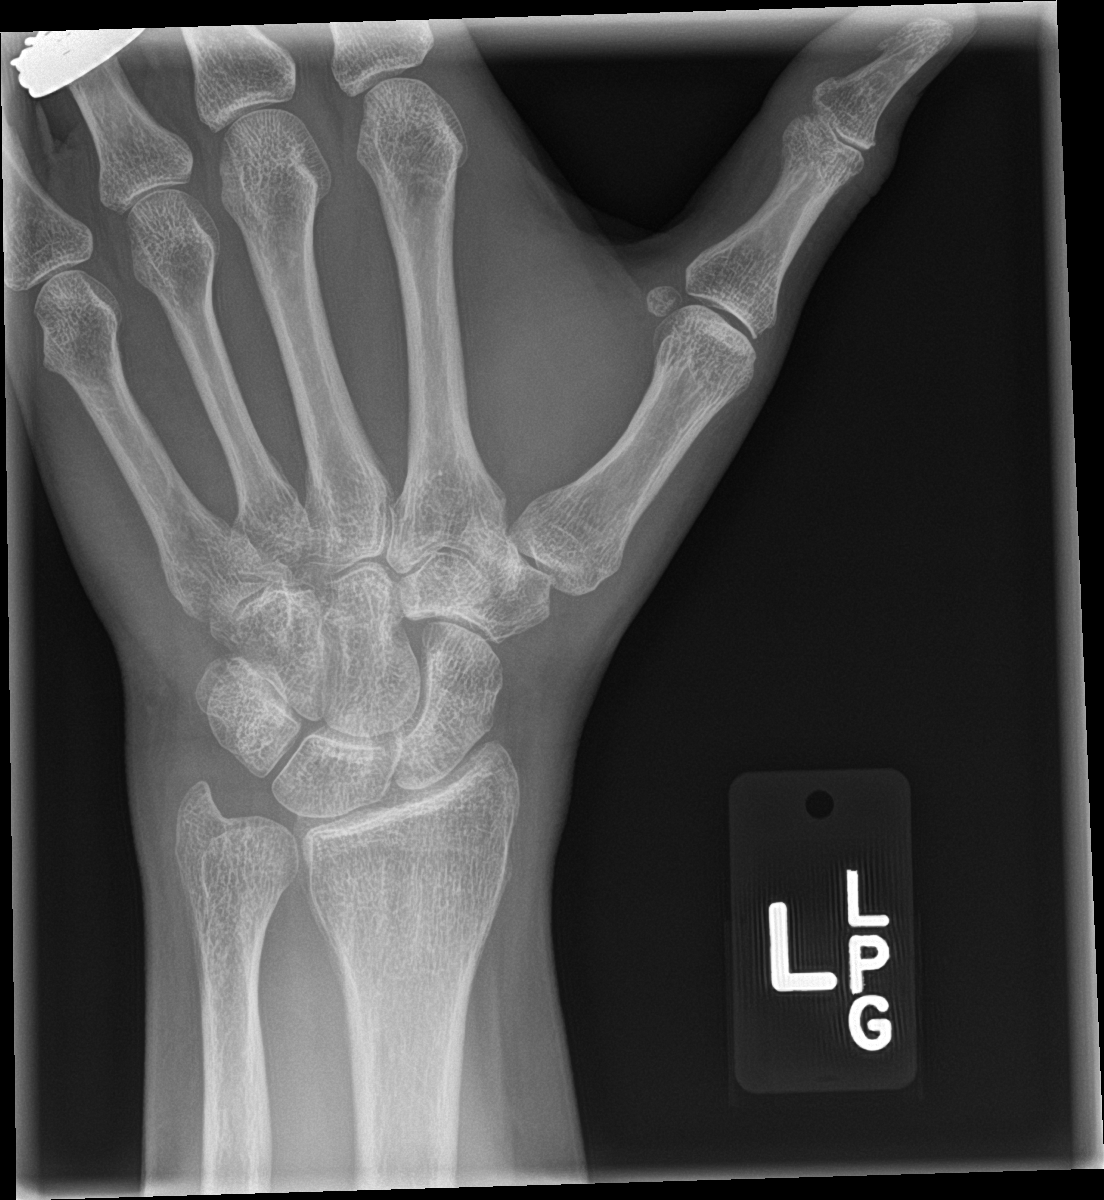

[4 of 4 positions shown; findings below may reference images not displayed]

FINDINGS: There is no evidence of fracture or dislocation. There is no
evidence of arthropathy or other focal bone abnormality. Soft
tissues are unremarkable.
IMPRESSION: Negative.
